# Patient Record
Sex: Male | Born: 1960 | Race: White | Hispanic: No | Marital: Married | Smoking: Former smoker
Health system: Southern US, Community
[De-identification: ages and names within clinical notes are randomized; demographics above are authoritative.]

## PROBLEM LIST (undated history)

## (undated) DIAGNOSIS — Z125 Encounter for screening for malignant neoplasm of prostate: Secondary | ICD-10-CM

## (undated) DIAGNOSIS — R161 Splenomegaly, not elsewhere classified: Secondary | ICD-10-CM

## (undated) DIAGNOSIS — G4730 Sleep apnea, unspecified: Secondary | ICD-10-CM

## (undated) DIAGNOSIS — D479 Neoplasm of uncertain behavior of lymphoid, hematopoietic and related tissue, unspecified: Secondary | ICD-10-CM

## (undated) DIAGNOSIS — K760 Fatty (change of) liver, not elsewhere classified: Secondary | ICD-10-CM

## (undated) DIAGNOSIS — I10 Essential (primary) hypertension: Secondary | ICD-10-CM

## (undated) DIAGNOSIS — C866 Primary cutaneous CD30-positive T-cell proliferations: Secondary | ICD-10-CM

## (undated) DIAGNOSIS — E785 Hyperlipidemia, unspecified: Secondary | ICD-10-CM

## (undated) HISTORY — DX: Fatty (change of) liver, not elsewhere classified: K76.0

## (undated) HISTORY — DX: Encounter for screening for malignant neoplasm of prostate: Z12.5

## (undated) HISTORY — DX: Hyperlipidemia, unspecified: E78.5

## (undated) HISTORY — PX: OTHER SURGICAL HISTORY: SHX169

## (undated) HISTORY — DX: Essential (primary) hypertension: I10

## (undated) HISTORY — DX: Neoplasm of uncertain behavior of lymphoid, hematopoietic and related tissue, unspecified: D47.9

## (undated) HISTORY — DX: Sleep apnea, unspecified: G47.30

## (undated) HISTORY — DX: Primary cutaneous CD30-positive T-cell proliferations: C86.6

## (undated) HISTORY — DX: Splenomegaly, not elsewhere classified: R16.1

---

## 2005-02-01 HISTORY — PX: OTHER SURGICAL HISTORY: SHX169

## 2014-04-02 ENCOUNTER — Ambulatory Visit
Admit: 2014-04-02 | Disposition: A | Discharge status: Home / Self Care | Payer: Self-pay | Attending: Internal Medicine | Admitting: Internal Medicine

## 2014-04-09 ENCOUNTER — Ambulatory Visit
Admit: 2014-04-09 | Disposition: A | Discharge status: Home / Self Care | Payer: Self-pay | Attending: Internal Medicine | Admitting: Internal Medicine

## 2014-04-23 ENCOUNTER — Ambulatory Visit: Payer: Self-pay | Admitting: Internal Medicine

## 2014-05-01 LAB — CBC CANCER CENTER
Basophil #: 0.1 x10 3/mm (ref 0.0–0.1)
Basophil %: 1.1 %
EOS ABS: 0.3 x10 3/mm (ref 0.0–0.7)
EOS PCT: 3.3 %
HCT: 40.4 % (ref 40.0–52.0)
HGB: 13.7 g/dL (ref 13.0–18.0)
Lymphocyte #: 1.7 x10 3/mm (ref 1.0–3.6)
Lymphocyte %: 20.2 %
MCH: 28.4 pg (ref 26.0–34.0)
MCHC: 34 g/dL (ref 32.0–36.0)
MCV: 84 fL (ref 80–100)
MONO ABS: 0.7 x10 3/mm (ref 0.2–1.0)
Monocyte %: 8.1 %
NEUTROS PCT: 67.3 %
Neutrophil #: 5.6 x10 3/mm (ref 1.4–6.5)
PLATELETS: 382 x10 3/mm (ref 150–440)
RBC: 4.84 10*6/uL (ref 4.40–5.90)
RDW: 15 % — ABNORMAL HIGH (ref 11.5–14.5)
WBC: 8.4 x10 3/mm (ref 3.8–10.6)

## 2014-05-03 ENCOUNTER — Ambulatory Visit
Admit: 2014-05-03 | Disposition: A | Discharge status: Home / Self Care | Payer: Self-pay | Attending: Internal Medicine | Admitting: Internal Medicine

## 2014-05-13 LAB — OCCULT BLOOD X 1 CARD TO LAB, STOOL
OCCULT BLOOD, FECES: NEGATIVE
OCCULT BLOOD, FECES: NEGATIVE
Occult Blood, Feces: NEGATIVE

## 2014-05-27 LAB — CBC CANCER CENTER
BASOS ABS: 0.1 x10 3/mm (ref 0.0–0.1)
Basophil %: 0.9 %
Eosinophil #: 0.2 x10 3/mm (ref 0.0–0.7)
Eosinophil %: 2.7 %
HCT: 39.9 % — AB (ref 40.0–52.0)
HGB: 13.5 g/dL (ref 13.0–18.0)
LYMPHS PCT: 19.9 %
Lymphocyte #: 1.7 x10 3/mm (ref 1.0–3.6)
MCH: 28.3 pg (ref 26.0–34.0)
MCHC: 33.7 g/dL (ref 32.0–36.0)
MCV: 84 fL (ref 80–100)
Monocyte #: 0.7 x10 3/mm (ref 0.2–1.0)
Monocyte %: 8.4 %
NEUTROS ABS: 5.9 x10 3/mm (ref 1.4–6.5)
Neutrophil %: 68.1 %
Platelet: 336 x10 3/mm (ref 150–440)
RBC: 4.76 10*6/uL (ref 4.40–5.90)
RDW: 15.3 % — ABNORMAL HIGH (ref 11.5–14.5)
WBC: 8.7 x10 3/mm (ref 3.8–10.6)

## 2014-05-27 LAB — IRON AND TIBC
IRON SATURATION: 15.5
Iron Bind.Cap.(Total): 343 (ref 250–450)
Iron: 53 ug/dL
UNBOUND IRON-BIND. CAP.: 289.9

## 2014-05-27 LAB — CREATININE, SERUM
CREATINE, SERUM: 1.13
Creatinine: 1.13 mg/dL
EGFR (African American): >60

## 2014-05-27 LAB — LACTATE DEHYDROGENASE: LDH: 139 U/L

## 2014-06-02 NOTE — Consult Note (Signed)
Reason for Visit: Referred by Dr. Cynda Acres.  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 Has had scalp nodules in the past although the more recent ones have been causing some consistent pain. He did have crusting around the nodules then developed several more down in the superior part of the posterior neck and scalp region. So a medical oncology underwent PET CT scan demonstrating hypermetabolic activity in the scalp in the area of the shave biopsy no other additional hypermetabolic areas to suggest lymphoma.he was presented at our weekly tumor conference further surgical resection of these lesions was not recommended patient is referred to radiation oncology for opinion. He is otherwise doing well.  Pathology Report pathology report reviewed   Imaging Report PET CT scan reviewed   Referral Report clinical notes reviewed   Planned Treatment Regimen observation versus regional electron-beam therapy.   HPI   as above  Past Hx:    Hypertension:    Diabetes:    Sleep Apnea:   Past, Family and Social History:  Past Medical History positive   Cardiovascular hypertension   Respiratory sleep apnea   Endocrine diabetes mellitus   Family History positive   Family History Comments father maternal aunt with lung cancer   Social History noncontributory   Additional Past Medical and Surgical History seen by himself today   Allergies:   No Known Allergies:   Home Meds:  Home Medications: Medication Instructions Status  triamterene 37.5 milligram(s) orally once a day Active  metFORMIN extended release 1000 mg oral tablet, extended release 1 tab(s) orally 2 times a day Active  lovastatin 40 mg oral tablet 1 tab(s) orally once a day Active  lisinopril 10 mg oral tablet 1 tab(s) orally once a day  Active  aspirin 81 mg oral tablet 1 tab(s) orally once a day Active  ibuprofen 800 mg oral tablet 1 tab(s) orally 4 times a day Active   Review of Systems:  General negative   Performance Status (ECOG) 0   Skin see HPI   Breast negative   Ophthalmologic negative   ENMT negative   Respiratory and Thorax negative   Cardiovascular negative   Gastrointestinal negative   Genitourinary negative   Musculoskeletal negative   Neurological negative   Psychiatric negative   Hematology/Lymphatics negative   Endocrine negative   Allergic/Immunologic negative   Review of Systems   denies any weight loss, fatigue, weakness, fever, chills or night sweats. Patient denies any loss of vision, blurred vision. Patient denies any ringing  of the ears or hearing loss. No irregular heartbeat. Patient denies heart murmur or history of fainting. Patient denies any chest pain or pain radiating to her upper extremities. Patient denies any shortness of breath, difficulty breathing at night, cough or hemoptysis. Patient denies any swelling in the lower legs. Patient denies any nausea vomiting, vomiting of blood, or coffee ground material in the vomitus. Patient denies any stomach pain. Patient states has had normal bowel movements no significant constipation or diarrhea. Patient denies any dysuria, hematuria or significant nocturia. Patient denies any problems walking, swelling in the joints or loss of balance. Patient denies any skin changes, loss of hair or loss of weight. Patient denies any excessive worrying or anxiety or significant depression. Patient denies any problems with insomnia. Patient denies excessive thirst, polyuria, polydipsia. Patient denies any swollen glands, patient denies easy  bruising or easy bleeding. Patient denies any recent infections, allergies or URI. Patient "s visual fields have not changed significantly in recent time.   Nursing Notes:  Nursing Vital Signs and Chemo  Nursing Nursing Notes: *CC Vital Signs Flowsheet:   30-Mar-16 11:27  Temp Temperature 97.5  Pulse Pulse 18  Respirations Respirations 18  SBP SBP 137  DBP DBP 80  Pain Scale (0-10)  0  Current Weight (kg) (kg) 134.4  Height (cm) centimeters 182.5  BSA (m2) 2.5   Physical Exam:  General/Skin/HEENT:  General normal   Eyes normal   ENMT normal   Head and Neck normal   Additional PE well-developed male in NAD. Has some crusting of areas of the scalp where he is at shave biopsy. He has multiple nodules present more towards the scalp neck hairline again consistent with known described entity. Oral cavity is clear. No evidence of sub-digastric cervical supraclavicular or axillary adenopathy is noted. Lungs are clear to A&P cardiac examination shows regular rate and rhythm.   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  Splenomegaly.    EXAM:  NUCLEAR MEDICINE PET SKULL BASE TO THIGH    TECHNIQUE:  12.2 mCi F-18 FDG was injected intravenously. Full-ring PET imaging  was performed from the skull base to thigh after the radiotracer. CT  data was obtained and used for attenuation correction and anatomic  localization.    FASTING BLOOD GLUCOSE:  Value: 169 mg/dl    COMPARISON:  None.    FINDINGS:  NECK /HEAD    There is a focus of metabolic activity associated with scalp  thickening over the  right parietal bone. Thisactivity is moderate  with SUV max equal 3.7. This is site of scalp shave biopsy for  growing lesion.  CHEST    No hypermetabolic mediastinal or hilar nodes. No suspicious  pulmonary nodules on the CT scan.    ABDOMEN/PELVIS    No abnormal hypermetabolic activity within the liver, pancreas,  adrenal glands, or spleen.    Normal volume spleen with a calculated volume of 369 cubic cm. No  abnormal metabolic activity in the spleen. No hypermetabolic  abdominal pelvic lymph nodes.    SKELETON  No focal hypermetabolic activity to suggest skeletal metastasis.     IMPRESSION:  1. Hypermetabolic scalp lesion at site of shave biopsy.  2. No hypermetabolic lymph nodes in the chest, abdomen, or pelvis.  3. No additional hypermetabolic cutaneous lesions identified.  4. Normal spleen.      Electronically Signed    By: Suzy Bouchard M.D.    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 it is quite radiosensitive and can be treated for palliation of 4500 cGy in 18 fractions at 250 cGy per fraction with excellent long-term complete response. Whenever the tumors tend to wax and wane and may jspontaneously clear. Other treatments included methotrexate. I discussed the case personally with medical oncology and at our weekly tumor board. I would like to continue to observe these lesions at this time since the area of electron beam therapy and a scalpel cause hair loss and would span a large area of his right central scalp as well as posterior neck region. I set him up for follow-up in 1 month. Should the lesions cause  considerable pain or discomfort of asked the patient to contact me so we can see him on a more urgent basis and initiate treatment.  I would like to take this opportunity for allowing me to participate in the care of your patient..  Electronic Signatures: Kylee Umana, Roda Shutters (MD)  (Signed 30-Mar-16 12:14)  Authored: HPI, Diagnosis, Past Hx, PFSH, Allergies, Home Meds, ROS, Nursing Notes, Physical Exam, Other Results, Relevent Results, Encounter Assessment and Plan   Last Updated: 30-Mar-16 12:14 by Armstead Peaks (MD)

## 2014-06-14 ENCOUNTER — Other Ambulatory Visit: Payer: Self-pay | Admitting: *Deleted

## 2014-06-14 DIAGNOSIS — R161 Splenomegaly, not elsewhere classified: Secondary | ICD-10-CM

## 2014-06-14 DIAGNOSIS — C866 Primary cutaneous CD30-positive T-cell proliferations: Secondary | ICD-10-CM

## 2014-06-17 ENCOUNTER — Inpatient Hospital Stay: Payer: No Typology Code available for payment source | Attending: Internal Medicine

## 2014-06-17 ENCOUNTER — Encounter: Payer: Self-pay | Admitting: *Deleted

## 2014-06-17 ENCOUNTER — Inpatient Hospital Stay (HOSPITAL_BASED_OUTPATIENT_CLINIC_OR_DEPARTMENT_OTHER): Payer: No Typology Code available for payment source | Admitting: Internal Medicine

## 2014-06-17 VITALS — BP 144/76 | HR 77 | Temp 98.0°F | Resp 18 | Ht 71.85 in | Wt 297.6 lb

## 2014-06-17 DIAGNOSIS — E119 Type 2 diabetes mellitus without complications: Secondary | ICD-10-CM | POA: Insufficient documentation

## 2014-06-17 DIAGNOSIS — R161 Splenomegaly, not elsewhere classified: Secondary | ICD-10-CM

## 2014-06-17 DIAGNOSIS — Z79899 Other long term (current) drug therapy: Secondary | ICD-10-CM | POA: Diagnosis not present

## 2014-06-17 DIAGNOSIS — C866 Primary cutaneous CD30-positive T-cell proliferations not having achieved remission: Secondary | ICD-10-CM

## 2014-06-17 DIAGNOSIS — I10 Essential (primary) hypertension: Secondary | ICD-10-CM | POA: Diagnosis not present

## 2014-06-17 DIAGNOSIS — K760 Fatty (change of) liver, not elsewhere classified: Secondary | ICD-10-CM

## 2014-06-17 DIAGNOSIS — E785 Hyperlipidemia, unspecified: Secondary | ICD-10-CM

## 2014-06-17 DIAGNOSIS — C8590 Non-Hodgkin lymphoma, unspecified, unspecified site: Secondary | ICD-10-CM

## 2014-06-17 LAB — CBC WITH DIFFERENTIAL/PLATELET
BASOS PCT: 1 %
Basophils Absolute: 0.1 10*3/uL (ref 0–0.1)
EOS ABS: 0.2 10*3/uL (ref 0–0.7)
EOS PCT: 2 %
HEMATOCRIT: 41.4 % (ref 40.0–52.0)
Hemoglobin: 13.7 g/dL (ref 13.0–18.0)
LYMPHS ABS: 1.6 10*3/uL (ref 1.0–3.6)
Lymphocytes Relative: 20 %
MCH: 27.8 pg (ref 26.0–34.0)
MCHC: 33.1 g/dL (ref 32.0–36.0)
MCV: 84 fL (ref 80.0–100.0)
MONO ABS: 0.6 10*3/uL (ref 0.2–1.0)
MONOS PCT: 8 %
Neutro Abs: 5.6 10*3/uL (ref 1.4–6.5)
Neutrophils Relative %: 69 %
Platelets: 371 10*3/uL (ref 150–440)
RBC: 4.92 MIL/uL (ref 4.40–5.90)
RDW: 15.3 % — ABNORMAL HIGH (ref 11.5–14.5)
WBC: 8.1 10*3/uL (ref 3.8–10.6)

## 2014-06-17 LAB — LACTATE DEHYDROGENASE: LDH: 121 U/L (ref 98–192)

## 2014-06-17 LAB — HEPATIC FUNCTION PANEL
ALT: 37 U/L (ref 17–63)
AST: 31 U/L (ref 15–41)
Albumin: 4.4 g/dL (ref 3.5–5.0)
Alkaline Phosphatase: 86 U/L (ref 38–126)
Bilirubin, Direct: 0.1 mg/dL (ref 0.1–0.5)
Indirect Bilirubin: 0.7 mg/dL (ref 0.3–0.9)
TOTAL PROTEIN: 7.4 g/dL (ref 6.5–8.1)
Total Bilirubin: 0.8 mg/dL (ref 0.3–1.2)

## 2014-06-17 LAB — CREATININE, SERUM
Creatinine, Ser: 1.09 mg/dL (ref 0.61–1.24)
GFR calc Af Amer: >60 mL/min (ref >60)
GFR calc non Af Amer: >60 mL/min (ref >60)

## 2014-06-17 NOTE — Progress Notes (Signed)
[facility] note   Referred by Daniel Paddy, MD [place], [place] [id number]   This 54 y.o. male patient presents to the clinic for f/u of lymphoma/papulosis of scalp   Chief Complaint/Problem List:   Chief Complaint/Diagnosis:   1. S/P SHAVE BIOPSY SCALP, LYMPHOMATOID PAPULOSIS, A CD30 POSITIVE T CELL INFILTRATE, LYMPHOMA  2. MILD NEUTROPHILLIA LIKELY REACTIVE AFTER BX,..normal later  3. BODERLINE HIGH RDW, LIKELY SPURRIOUS, iron sat normal at 20% and ferritin at 189  3/15, 3 stools neg april, now iron sat 15%    4. SPLENOMEGALY ON U/S, POSSIBLY DUE TO LYMPHOMA, OR LIVER DISEASE, .Marland KitchenMarland KitchenNO SPLENOMEGALY ON PET/CT  5. FATTY LIVER ON U/S, LIKELY DUE TO DM  6. BORDELINE DILATED COMMON BILE DUCT, INCOMPLETELY VISUALIZED, REVIEWED WITH RADIOLOGY CONDIDERED NORMAL NO F/U OF BILIARY TREE NEEDED    HPI: Patient seen today for follow-up. Last seen April 25. No acute complaints. Lesions on the scalp are slowly healing no new scalp lesions and no other skin lesions or lymph node or masses or nodules. No constitutional symptoms except some fatigue. No sweats no fever or chills. No cough or shortness of breath     Review of Systems:  General: No acute distress,, No recent weight loss, fever chills or sweats   HEENT: No headache, dizziness, ear or jaw pain, or epistaxis   Lungs: No cough, shortness of breath, at rest, No SOBOE, No wheezing, No chest pain, No, hemoptysis  Cardiac: no chest pain, no palpitations, no orthostasis, no lower extremity    GI: no abdominal pain, nausea, vomiting, diahrrea, or reflux   GU: no dysuria, no hematuria,   Musculoskeletal: no back pain, no bone pain, no acutely painful joints,   Extremities: no upper or lower extremity edema  Skin: no bruising, no rash  Neuro: no headache, no dizzy, no focal weakness   Psych: no anxiety, no depression   Allergies Allergies  Allergen Reactions  . No Known Allergies     Significant History/PMH: Past  Medical History  Diagnosis Date  . Lymphoproliferative disorder     t-cell  . Lymphomatoid papulosis     scalp bx  . Splenomegaly   . Fatty liver   . Encounter for special screening examination for neoplasm of prostate     history of prostate exam 11/2013  . Hyperlipidemia   . HTN (hypertension)   . Sleep apnea    Past Surgical History  Procedure Laterality Date  . Biopsy scalp    . Colonscopy    2007            Smoking History: Never smoker, Past Smoker, Current smoker, No interested in quitting  PFSH: Family History:  Family History  Problem Relation Age of Onset  . Lung cancer Father   . Lung cancer Maternal Uncle     Comments:   Social History:  History  Alcohol Use No    Additional Past Medical and Surgical History:    Home Medications: Prior to Admission medications   Medication Sig Start Date End Date Taking? Authorizing Provider  lisinopril (PRINIVIL,ZESTRIL) 10 MG tablet Take 10 mg by mouth daily.    Historical Provider, MD  lovastatin (MEVACOR) 40 MG tablet Take 40 mg by mouth at bedtime.    Historical Provider, MD  metFORMIN (GLUCOPHAGE) 1000 MG tablet Take 1,000 mg by mouth 2 (two) times daily with a meal.    Historical Provider, MD  triamterene-hydrochlorothiazide (DYAZIDE) 37.5-25 MG per capsule Take 1 capsule by  mouth daily.    Historical Provider, MD    Vital Signs:  Blood pressure 144/76, pulse 77, temperature 98 F (36.7 C), temperature source Oral, resp. rate 18, height 5' 11.85" (1.825 m), weight 297 lb 9.9 oz (135 kg).  Physical Exam:  General: well developed, well nourished, and no acute distress  Mental Status: alert and oriented to person, place and time  Head, Ears, Nose,Throat: No thrush  Respiratory: no rales, rhonchi, or wheezing, no dullness  Cardiovascular: regular rate and rhythm  Gastrointestinal: soft, non tender, no masses or organomegaly  Musculoskeletal: no lower extremity edema no calf tenderness  Skin: no rashes, no  bruises.. On the scalp there is still 2 healing areas, a large eschar is gone there is a larger and smaller area with granulation tissue there were no other palpable nodules or skin lesions   Neurological: No gross focal weakness cranial nerves intact  Lymphatics: Not palpable, neck supraclavicular, submandibular, axilla    Psych: Mood, Affect, Unremarkable    Laboratory Results: Appointment on 06/17/2014  Component Date Value Ref Range Status  . WBC 06/17/2014 8.1  3.8 - 10.6 K/uL Final  . RBC 06/17/2014 4.92  4.40 - 5.90 MIL/uL Final  . Hemoglobin 06/17/2014 13.7  13.0 - 18.0 g/dL Final  . HCT 06/17/2014 41.4  40.0 - 52.0 % Final  . MCV 06/17/2014 84.0  80.0 - 100.0 fL Final  . MCH 06/17/2014 27.8  26.0 - 34.0 pg Final  . MCHC 06/17/2014 33.1  32.0 - 36.0 g/dL Final  . RDW 06/17/2014 15.3* 11.5 - 14.5 % Final  . Platelets 06/17/2014 371  150 - 440 K/uL Final  . Neutrophils Relative % 06/17/2014 69   Final  . Neutro Abs 06/17/2014 5.6  1.4 - 6.5 K/uL Final  . Lymphocytes Relative 06/17/2014 20   Final  . Lymphs Abs 06/17/2014 1.6  1.0 - 3.6 K/uL Final  . Monocytes Relative 06/17/2014 8   Final  . Monocytes Absolute 06/17/2014 0.6  0.2 - 1.0 K/uL Final  . Eosinophils Relative 06/17/2014 2   Final  . Eosinophils Absolute 06/17/2014 0.2  0 - 0.7 K/uL Final  . Basophils Relative 06/17/2014 1   Final  . Basophils Absolute 06/17/2014 0.1  0 - 0.1 K/uL Final  . Creatinine, Ser 06/17/2014 1.09  0.61 - 1.24 mg/dL Final  . GFR calc non Af Amer 06/17/2014 >60  >60 mL/min Final  . GFR calc Af Amer 06/17/2014 >60  >60 mL/min Final   Comment: (NOTE) The eGFR has been calculated using the CKD EPI equation. This calculation has not been validated in all clinical situations. eGFR's persistently <60 mL/min signify possible Chronic Kidney Disease.   Marland Kitchen LDH 06/17/2014 121  98 - 192 U/L Final  . Total Protein 06/17/2014 7.4  6.5 - 8.1 g/dL Final  . Albumin 06/17/2014 4.4  3.5 - 5.0 g/dL Final  .  AST 06/17/2014 31  15 - 41 U/L Final  . ALT 06/17/2014 37  17 - 63 U/L Final  . Alkaline Phosphatase 06/17/2014 86  38 - 126 U/L Final  . Total Bilirubin 06/17/2014 0.8  0.3 - 1.2 mg/dL Final  . Bilirubin, Direct 06/17/2014 0.1  0.1 - 0.5 mg/dL Final  . Indirect Bilirubin 06/17/2014 0.7  0.3 - 0.9 mg/dL Final          Radiology Results: No results found. PET CT scan was 04/23/14         Assessment and Plan: Impression: SEE ALSO  PROBLEM  LIST  1. LYMPHOMATOID PAPULOSIS.Marland KitchenPOSSIBLE LYMPHOMA, ACTING LIKE MALIGNANCY, THERE IS SPLENOMEGALY, ON U/S  AND FATTY LIVER.. But the spleen is normal size on PET/CT. SIEP NORMAL. CLL PROFILE NORMAL HIV normal. EXAM NEGATIVE FOR ADENOPATHY, NO B SYMPTOMS.  PET DONE NO SPLENOMEGALY OR ANY AENOPATHY ON PET  2.. MILD DILATION PROXIMAL BILE DUCT, DISTAL NOT VISUALIZED , on initial ultrasound report, I have reviewed with radiology and advised that this is normal and no follow-up images of the biliary tree are required.  3. BORDERLINE NORMAL HGB, AND ELEVATED RDW, IRON SAT NORMAL 20%, ON REPEAT 15%   Plan: As prior noted have already discussed case in conference. Medicine will follow for fatty liver. I will repeat iron studies and stool guaiacs. I will continue to follow the CBC, creatinine, LDH, clinical exam, in the absence of any new findings of PET scan after 3 months or approximately mid to late June. Also follow liver function tests.

## 2014-07-11 ENCOUNTER — Other Ambulatory Visit: Payer: No Typology Code available for payment source

## 2014-07-11 ENCOUNTER — Ambulatory Visit: Payer: No Typology Code available for payment source | Admitting: Internal Medicine

## 2014-07-18 ENCOUNTER — Other Ambulatory Visit: Payer: No Typology Code available for payment source

## 2014-07-18 ENCOUNTER — Ambulatory Visit: Payer: No Typology Code available for payment source | Admitting: Family Medicine

## 2014-07-25 ENCOUNTER — Other Ambulatory Visit: Payer: No Typology Code available for payment source

## 2014-07-25 ENCOUNTER — Ambulatory Visit: Payer: No Typology Code available for payment source | Admitting: Family Medicine

## 2014-09-26 ENCOUNTER — Ambulatory Visit: Payer: No Typology Code available for payment source | Admitting: Family Medicine

## 2014-09-26 ENCOUNTER — Other Ambulatory Visit: Payer: No Typology Code available for payment source

## 2014-10-03 ENCOUNTER — Ambulatory Visit: Payer: No Typology Code available for payment source | Admitting: Family Medicine

## 2014-10-03 ENCOUNTER — Other Ambulatory Visit: Payer: No Typology Code available for payment source

## 2014-10-10 ENCOUNTER — Other Ambulatory Visit: Payer: No Typology Code available for payment source

## 2014-10-10 ENCOUNTER — Ambulatory Visit: Payer: No Typology Code available for payment source | Admitting: Family Medicine

## 2014-10-25 ENCOUNTER — Other Ambulatory Visit: Payer: No Typology Code available for payment source

## 2014-10-25 ENCOUNTER — Ambulatory Visit: Payer: No Typology Code available for payment source

## 2017-03-04 IMAGING — CR DG CHEST 2V
1 series · 2 of 2 positions shown · non-contrast
Comparison: None

CLINICAL DATA: Indications of a T-cell lymphoproliferative
disorder, hypertension, diabetes

EXAM:
CHEST  2 VIEW

[Series 1: dxr chest pa (or ap) and lateral · 0.14mm/px · 2 of 2 slices shown]
[im 1/2]
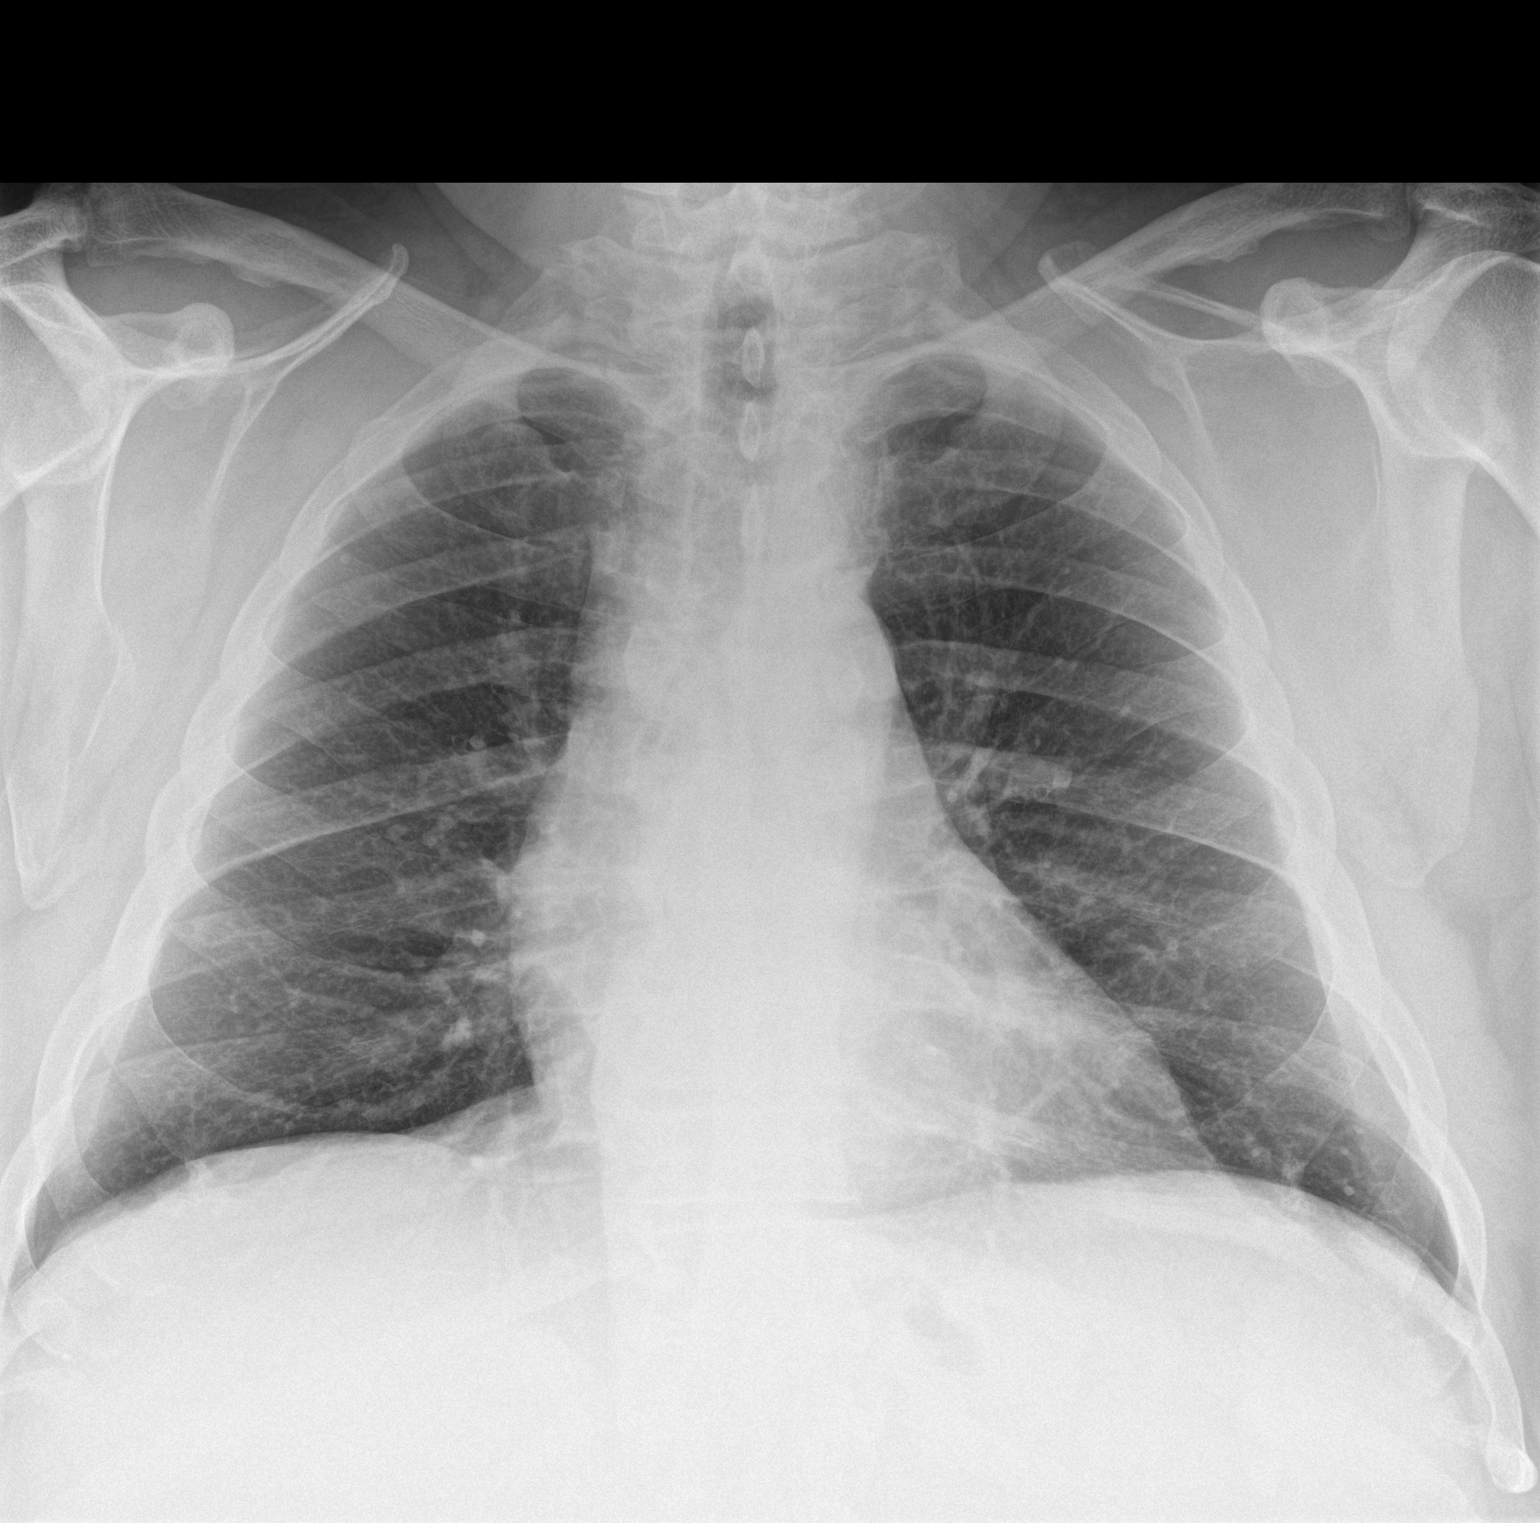
[im 2/2]
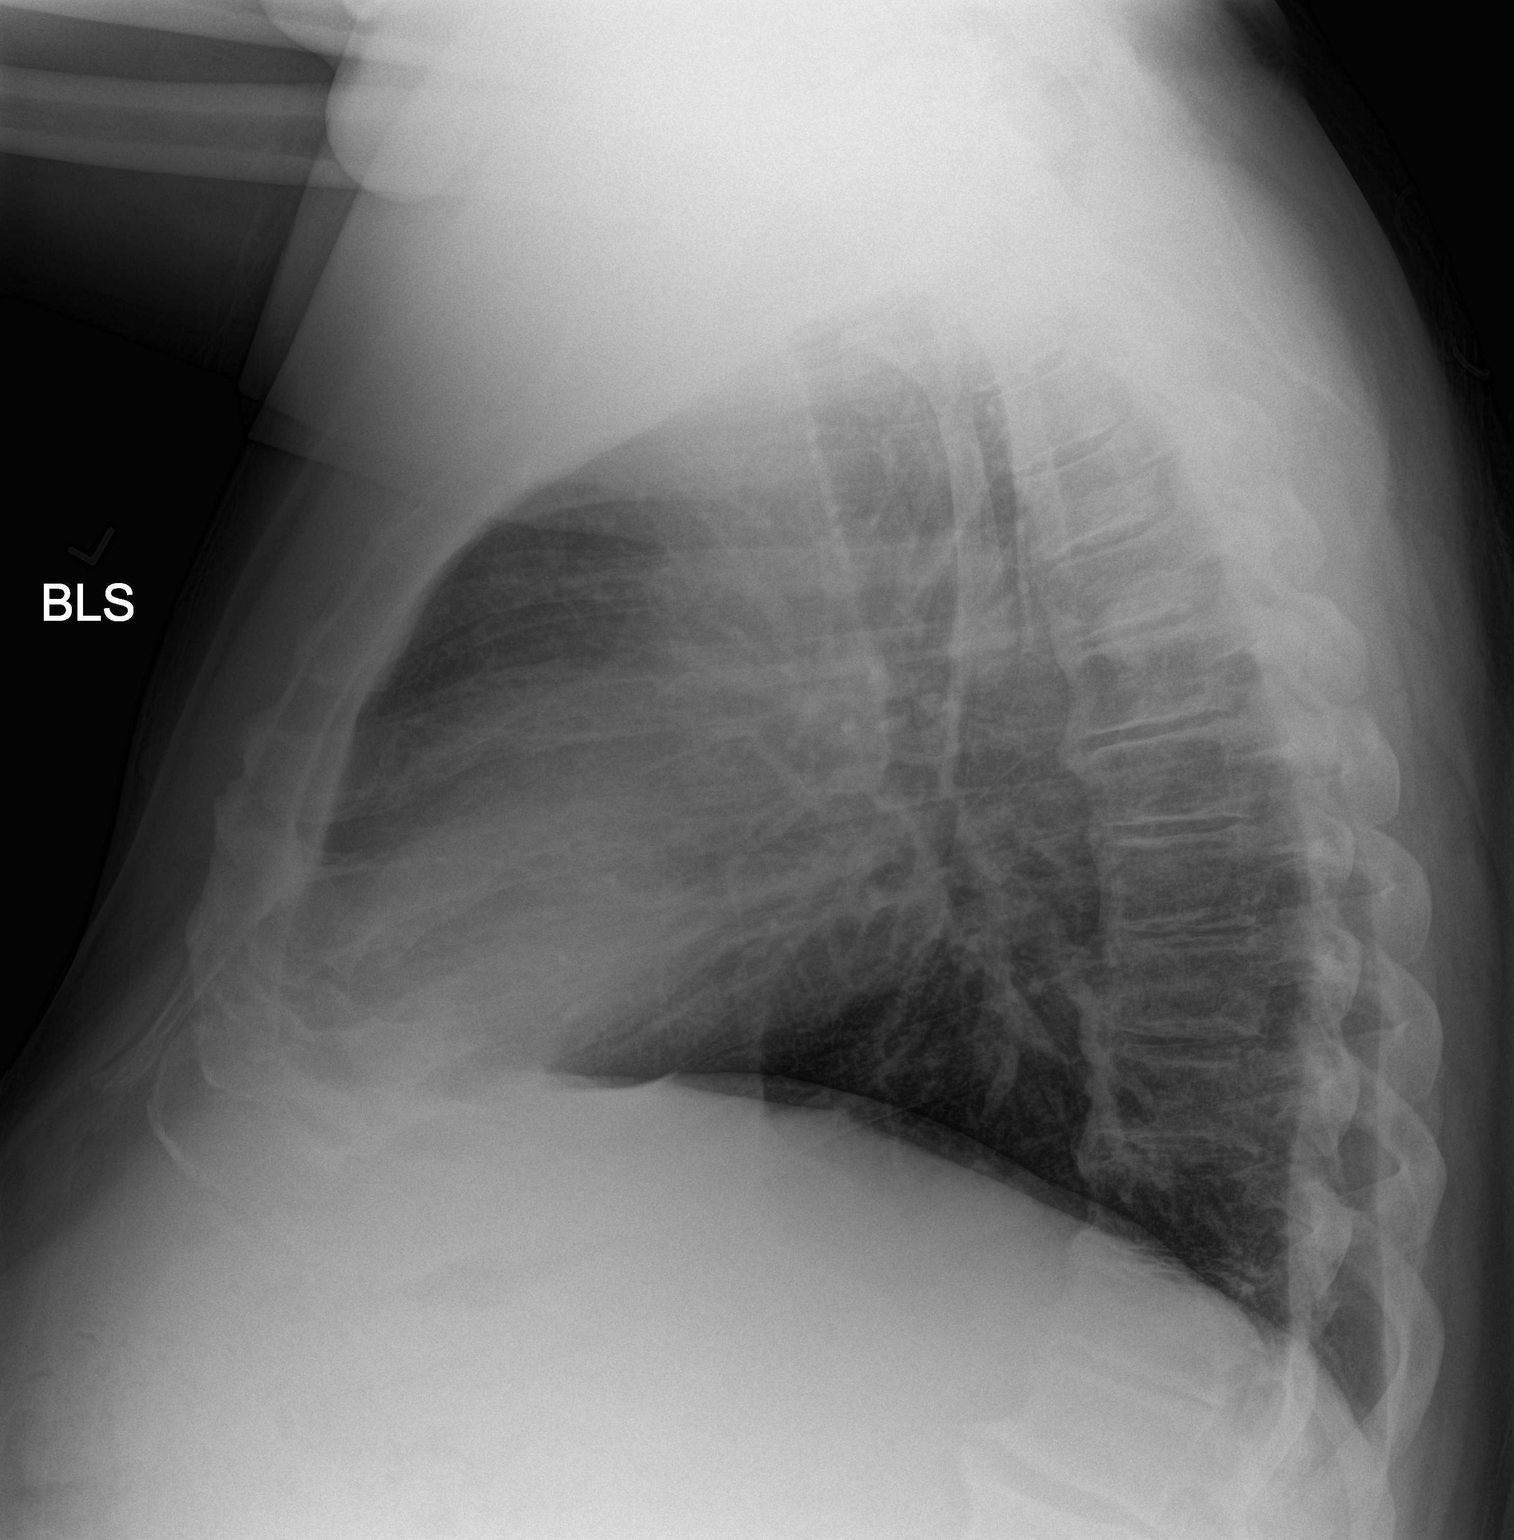

[2 of 2 positions shown; findings below may reference images not displayed]

FINDINGS: Normal heart size, mediastinal contours, and pulmonary vascularity.

Peribronchial thickening without infiltrate, pleural effusion or
pneumothorax.

Tiny calcified granuloma RIGHT upper lobe.

Scattered endplate spur formation thoracic spine.
IMPRESSION: Minimal bronchitic changes.

Otherwise normal exam.

## 2017-03-12 IMAGING — CT NM PET TUM IMG INITIAL (PI) SKULL BASE T - THIGH
1 of 10 series · 1 of 25 positions shown · non-contrast
Comparison: None.

CLINICAL DATA: Initial treatment strategy for T-cell lymphoma.
Splenomegaly.

EXAM:
NUCLEAR MEDICINE PET SKULL BASE TO THIGH
TECHNIQUE: 12.2 mCi F-18 FDG was injected intravenously. Full-ring PET imaging
was performed from the skull base to thigh after the radiotracer. CT
data was obtained and used for attenuation correction and anatomic
localization.
FASTING BLOOD GLUCOSE:  Value: 169 mg/dl

[Series 4: pet wb (ac) · axial · 5.0mm · 4.07mm/px · 1 of 368 slices shown]
[im 184/368]
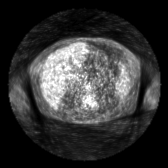

[1 of 25 positions shown; findings below may reference images not displayed]

FINDINGS: NECK /HEAD

There is a focus of metabolic activity associated with scalp
thickening over the right parietal bone. This activity is moderate
with SUV max equal 3.7. This is site of scalp shave biopsy for
growing lesion.

CHEST

No hypermetabolic mediastinal or hilar nodes. No suspicious
pulmonary nodules on the CT scan.

ABDOMEN/PELVIS

No abnormal hypermetabolic activity within the liver, pancreas,
adrenal glands, or spleen.

Normal volume spleen with a calculated volume of 369 cubic cm. No
abnormal metabolic activity in the spleen. No hypermetabolic
abdominal pelvic lymph nodes.

SKELETON

No focal hypermetabolic activity to suggest skeletal metastasis.
IMPRESSION: 1. Hypermetabolic scalp lesion at site of shave biopsy.
2. No hypermetabolic lymph nodes in the chest, abdomen, or pelvis.
3. No additional hypermetabolic cutaneous lesions identified.
4. Normal spleen.
